# Patient Record
Sex: Female | Born: 1990 | Race: Black or African American | Hispanic: No | Marital: Single | Smoking: Never smoker
Health system: Southern US, Community
[De-identification: ages and names within clinical notes are randomized; demographics above are authoritative.]

## PROBLEM LIST (undated history)

## (undated) ENCOUNTER — Inpatient Hospital Stay (HOSPITAL_COMMUNITY): Payer: Self-pay

## (undated) DIAGNOSIS — G43909 Migraine, unspecified, not intractable, without status migrainosus: Secondary | ICD-10-CM

## (undated) HISTORY — PX: NO PAST SURGERIES: SHX2092

---
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
## 2012-02-04 ENCOUNTER — Emergency Department (INDEPENDENT_AMBULATORY_CARE_PROVIDER_SITE_OTHER)
Admission: EM | Admit: 2012-02-04 | Discharge: 2012-02-04 | Disposition: A | Discharge status: Home / Self Care | Source: Home / Self Care | Attending: Emergency Medicine | Admitting: Emergency Medicine

## 2012-02-04 ENCOUNTER — Encounter (HOSPITAL_COMMUNITY): Payer: Self-pay | Admitting: *Deleted

## 2012-02-04 DIAGNOSIS — B379 Candidiasis, unspecified: Secondary | ICD-10-CM

## 2012-02-04 DIAGNOSIS — N3000 Acute cystitis without hematuria: Secondary | ICD-10-CM

## 2012-02-04 DIAGNOSIS — B49 Unspecified mycosis: Secondary | ICD-10-CM

## 2012-02-04 DIAGNOSIS — N898 Other specified noninflammatory disorders of vagina: Secondary | ICD-10-CM

## 2012-02-04 LAB — POCT URINALYSIS DIP (DEVICE)
Protein, ur: 30 mg/dL — AB
Specific Gravity, Urine: 1.025 (ref 1.005–1.030)
Urobilinogen, UA: 1 mg/dL (ref 0.0–1.0)

## 2012-02-04 LAB — WET PREP, GENITAL: Trich, Wet Prep: NONE SEEN

## 2012-02-04 MED ORDER — LIDOCAINE HCL 2 % EX GEL: CUTANEOUS | Status: AC | PRN

## 2012-02-04 MED ORDER — ACYCLOVIR 400 MG PO TABS: 400.0000 mg | ORAL_TABLET | Freq: Three times a day (TID) | ORAL | Status: AC

## 2012-02-04 MED ORDER — NITROFURANTOIN MONOHYD MACRO 100 MG PO CAPS: 100.0000 mg | ORAL_CAPSULE | Freq: Two times a day (BID) | ORAL | Status: AC

## 2012-02-04 MED ORDER — FLUCONAZOLE 150 MG PO TABS: 150.0000 mg | ORAL_TABLET | Freq: Once | ORAL | Status: AC

## 2012-02-04 NOTE — ED Provider Notes (Signed)
Medical screening examination/treatment/procedure(s) were performed by non-physician practitioner and as supervising physician I was immediately available for consultation/collaboration.  Leslee Home, M.D.   Reuben Likes, MD 02/04/12 2239

## 2012-02-04 NOTE — ED Notes (Signed)
Pt  reports symptoms  Of  painfull  Frequent  Urination       With  Burning  Sensation in vaginal  Area   As  Well  As   A  Discharge         Pt  Is  5  Months  preg     -  Pt  Denied  Any  Bleeding         No  abd  Pain

## 2012-02-04 NOTE — ED Provider Notes (Signed)
History     CSN: [id number]  Arrival date & time 02/04/12  1127   None     Chief Complaint  Patient presents with  . Vaginal Discharge    (Consider location/radiation/quality/duration/timing/severity/associated sxs/prior treatment) The history is provided by the patient.  FAELYN SIGLER is a 21 y.o. female who complains of urinary frequency, urgency and dysuria x 2 days, without flank pain, fever, chills, or abnormal vaginal discharge or bleeding.   She does admit to vaginal "bumps" that are painful.  Has not taken medication for symptom relief.  Has no history of UTI, denies known kidney disease or structural abnormalities.  Last sexual intercourse one week ago, unprotected. Patient's last menstrual period was 08/27/2011.  Currently [redacted] weeks pregnant.    Past Medical History  Diagnosis Date  . No pertinent past medical history     Past Surgical History  Procedure Date  . No past surgeries     Family History  Problem Relation Age of Onset  . Anesthesia problems Neg Hx     History  Substance Use Topics  . Smoking status: Never Smoker   . Smokeless tobacco: Never Used  . Alcohol Use: No     socially drinks 1/2 drink/week    OB History    Grav Para Term Preterm Abortions TAB SAB Ect Mult Living   2    1     0      Review of Systems  Constitutional: Negative.   Respiratory: Negative.   Cardiovascular: Negative.   Gastrointestinal: Negative.   Genitourinary: Positive for dysuria, urgency, frequency, genital sores and vaginal pain. Negative for hematuria, flank pain, vaginal bleeding, vaginal discharge and pelvic pain.    Allergies  Review of patient's allergies indicates no known allergies.  Home Medications   Current Outpatient Rx  Name Route Sig Dispense Refill  . PRENATAL S PO Oral Take by mouth.    . ACYCLOVIR 400 MG PO TABS Oral Take 1 tablet (400 mg total) by mouth 3 (three) times daily. 60 tablet 5  . FLUCONAZOLE 150 MG PO TABS Oral Take 1 tablet (150  mg total) by mouth once. 1 tablet 0  . LIDOCAINE HCL 2 % EX GEL Topical Apply topically as needed. 30 mL 1  . NITROFURANTOIN MONOHYD MACRO 100 MG PO CAPS Oral Take 1 capsule (100 mg total) by mouth 2 (two) times daily. 14 capsule 0    BP 122/82  Pulse 90  Temp 98.3 F (36.8 C) (Oral)  Resp 16  SpO2 100%  LMP 08/27/2011  Physical Exam  Nursing note and vitals reviewed. Constitutional: She is oriented to person, place, and time. Vital signs are normal. She appears well-developed and well-nourished. She is active and cooperative.  HENT:  Head: Normocephalic.  Eyes: Conjunctivae are normal. Pupils are equal, round, and reactive to light. No scleral icterus.  Neck: Trachea normal. Neck supple.  Cardiovascular: Normal rate, regular rhythm, normal heart sounds, intact distal pulses and normal pulses.   Pulmonary/Chest: Effort normal and breath sounds normal.  Abdominal: There is no tenderness. There is no CVA tenderness.       Appears stated weeks in pregnancy.  Genitourinary: Uterus normal.    Pelvic exam was performed with patient supine. No labial fusion. There is tenderness and lesion on the right labia. There is tenderness and lesion on the left labia. Cervix exhibits no motion tenderness, no discharge and no friability. Right adnexum displays no tenderness. Left adnexum displays no tenderness. Vaginal discharge found.  Lymphadenopathy:       Right: No inguinal adenopathy present.       Left: No inguinal adenopathy present.  Neurological: She is alert and oriented to person, place, and time. No cranial nerve deficit or sensory deficit.  Skin: Skin is warm and dry.  Psychiatric: She has a normal mood and affect. Her speech is normal and behavior is normal. Judgment and thought content normal. Cognition and memory are normal.    ED Course  Procedures (including critical care time)  Labs Reviewed  POCT URINALYSIS DIP (DEVICE) - Abnormal; Notable for the following:    Ketones, ur  15 (*)     Hgb urine dipstick SMALL (*)     Protein, ur 30 (*)     Leukocytes, UA SMALL (*)  Biochemical Testing Only. Please order routine urinalysis from main lab if confirmatory testing is needed.   All other components within normal limits  WET PREP, GENITAL - Abnormal; Notable for the following:    Yeast Wet Prep HPF POC FEW (*)     WBC, Wet Prep HPF POC TOO NUMEROUS TO COUNT (*)     All other components within normal limits  HERPES SIMPLEX VIRUS CULTURE  URINE CULTURE  HSV 2 ANTIBODY, IGG  HSV 1 ANTIBODY, IGG   No results found.   1. Vaginal lesion   2. Acute cystitis   3. Yeast infection       MDM  Await herpes culture results.  Sitz baths, lidocaine gel for comfort.  Take medication as prescribed.  Follow up with your ob/gyn as scheduled on Friday 02/07/12, make them aware of current diagnosis and treatment.          Johnsie Kindred, NP 02/04/12 1354

## 2012-02-05 LAB — HSV 1 ANTIBODY, IGG: HSV 1 Glycoprotein G Ab, IgG: <0.1 IV

## 2012-02-06 ENCOUNTER — Telehealth (HOSPITAL_COMMUNITY): Payer: Self-pay | Admitting: *Deleted

## 2012-02-06 LAB — URINE CULTURE

## 2012-02-06 NOTE — ED Notes (Signed)
Pt. called on  VM 8/28 and said she was told to call back, if " her body rejected the medication."  She said she vomited this morning. I called pt. back and she said she is taking it with food.  She was able to keep it down last night and this morning. I told her to keep taking it with food and call back if she vomits it again.  She does not know the name of the antibiotic. Pt. told if she still has urine symptoms after she finished the Macrobid she would need to have the culture rechecked. Pt. verified x 2 and given results. 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 Pt. told she was adequately treated with Diflucan for the yeast and Acyclovir for the Herpes.  This would be her first outbreak.  Pt. instructed to notify her partner. You can pass the virus even when you don't have an outbreak, so always practice safe sex. Get treated for each outbreak with Acyclovir or Valtrex. You may want to get an OB-GYN or PCP who can call in a prescription for you when you have an outbreak or give you a years Rx. That she can refill with each outbreak.  Pt. voiced understanding. Vassie Moselle 02/06/2012

## 2012-07-15 ENCOUNTER — Encounter (HOSPITAL_COMMUNITY): Payer: Self-pay | Admitting: *Deleted

## 2012-07-15 ENCOUNTER — Emergency Department (INDEPENDENT_AMBULATORY_CARE_PROVIDER_SITE_OTHER)
Admission: EM | Admit: 2012-07-15 | Discharge: 2012-07-15 | Disposition: A | Discharge status: Home / Self Care | Payer: BC Managed Care – PPO | Source: Home / Self Care | Attending: Emergency Medicine | Admitting: Emergency Medicine

## 2012-07-15 DIAGNOSIS — G43909 Migraine, unspecified, not intractable, without status migrainosus: Secondary | ICD-10-CM

## 2012-07-15 HISTORY — DX: Migraine, unspecified, not intractable, without status migrainosus: G43.909

## 2012-07-15 MED ORDER — ONDANSETRON 4 MG PO TBDP
ORAL_TABLET | ORAL | Status: AC
  Filled 2012-07-15: qty 2

## 2012-07-15 MED ORDER — SUMATRIPTAN SUCCINATE 50 MG PO TABS: 50.0000 mg | ORAL_TABLET | ORAL | Status: DC | PRN

## 2012-07-15 MED ORDER — KETOROLAC TROMETHAMINE 60 MG/2ML IM SOLN
INTRAMUSCULAR | Status: AC
  Filled 2012-07-15: qty 2

## 2012-07-15 MED ORDER — DEXAMETHASONE SODIUM PHOSPHATE 10 MG/ML IJ SOLN
10.0000 mg | Freq: Once | INTRAMUSCULAR | Status: AC
  Administered 2012-07-15: 10 mg via INTRAMUSCULAR

## 2012-07-15 MED ORDER — DEXAMETHASONE SODIUM PHOSPHATE 10 MG/ML IJ SOLN
INTRAMUSCULAR | Status: AC
  Filled 2012-07-15: qty 1

## 2012-07-15 MED ORDER — KETOROLAC TROMETHAMINE 60 MG/2ML IM SOLN
60.0000 mg | Freq: Once | INTRAMUSCULAR | Status: AC
  Administered 2012-07-15: 60 mg via INTRAMUSCULAR

## 2012-07-15 NOTE — ED Provider Notes (Signed)
Chief Complaint  Patient presents with  . Migraine    History of Present Illness:   Tiffany Wang is in female has had a 7 year history of migraine headaches. A typical migraine began last night around 2 AM without any obvious precipitating factor. It began in the left side and now has spread to the right. It still throbbing and sharp and rated a 5-6/10 in intensity and associated with blurry vision. It's worse with head movement. She denies any photophobia or phonophobia. She has had some nausea but no vomiting. She's had some numbness of the left side of the face and both hands but no numbness of the legs, weakness, difficulty with speech, ambulation, coordination, dizziness, or balance. She denies any fever, chills, or stiff neck. She has seen at her local primary care physician for the migraines and was prescribed naproxen.  Review of Systems:  Other than noted above, the patient denies any of the following symptoms: Systemic:  No fever, chills, fatigue, photophobia, stiff neck. Eye:  No redness, eye pain, discharge, blurred vision, or diplopia. ENT:  No nasal congestion, rhinorrhea, sinus pressure or pain, sneezing, earache, or sore throat.  No jaw claudication. Neuro:  No paresthesias, loss of consciousness, seizure activity, muscle weakness, trouble with coordination or gait, trouble speaking or swallowing. Psych:  No depression, anxiety or trouble sleeping.  PMFSH:  Past medical history, family history, social history, meds, and allergies were reviewed.  Physical Exam:   Vital signs:  BP 116/79  Pulse 74  Temp 98.6 F (37 C) (Oral)  Resp 18  SpO2 100%  LMP 07/08/2011  Breastfeeding? Unknown General:  Alert and oriented.  In no distress. Eye:  Lids and conjunctivas normal.  PERRL,  Full EOMs.  Fundi benign with normal discs and vessels. ENT:  No cranial or facial tenderness to palpation.  TMs and canals clear.  Nasal mucosa was normal and uncongested without any drainage. No intra  oral lesions, pharynx clear, mucous membranes moist, dentition normal. Neck:  Supple, full ROM, no tenderness to palpation.  No adenopathy or mass. Neuro:  Alert and orented times 3.  Speech was clear, fluent, and appropriate.  Cranial nerves intact. No pronator drift, muscle strength normal. Finger to nose normal.  DTRs were 2+ and symmetrical.Station and gait were normal.  Romberg's sign was normal.  Able to perform tandem gait well. Psych:  Normal affect.  Medications given in [facility]:  Since he is driving, she was given Decadron 10 mg IM and Toradol 60 mg IM. She was told to take Benadryl 50 mg as soon she gets back home and to try to go to sleep.  Assessment:  The encounter diagnosis was Migraine headache.  Plan:   1.  The following meds were prescribed:   New Prescriptions   SUMATRIPTAN (IMITREX) 50 MG TABLET    Take 1 tablet (50 mg total) by mouth every 2 (two) hours as needed for migraine.   2.  The patient was instructed in symptomatic care and handouts were given. 3.  The patient was told to return if becoming worse in any way, if no better in 3 or 4 days, and given some red flag symptoms that would indicate earlier return.    Reuben Likes, MD 07/15/12 [phone]

## 2012-07-15 NOTE — ED Notes (Signed)
Pt  Reports   A  Migraine  Headache        yest         Has  History  Of this in  Past   She  Reports  Nausea  No  Vomiting  -  She  Is  Sitting  Upright on  Exam table  In no  Severe  Distress           She  Does  However report  Some  Tingling  Face

## 2013-01-15 ENCOUNTER — Ambulatory Visit (INDEPENDENT_AMBULATORY_CARE_PROVIDER_SITE_OTHER): Payer: BC Managed Care – PPO | Admitting: Family Medicine

## 2013-01-15 ENCOUNTER — Encounter: Payer: Self-pay | Admitting: Family Medicine

## 2013-01-15 VITALS — BP 120/84 | HR 72 | Temp 98.0°F | Resp 16 | Ht 60.25 in | Wt 165.4 lb

## 2013-01-15 DIAGNOSIS — G43909 Migraine, unspecified, not intractable, without status migrainosus: Secondary | ICD-10-CM

## 2013-01-15 DIAGNOSIS — R110 Nausea: Secondary | ICD-10-CM

## 2013-01-15 MED ORDER — RIZATRIPTAN BENZOATE 10 MG PO TABS: 10.0000 mg | ORAL_TABLET | ORAL | Status: AC | PRN

## 2013-01-15 NOTE — Patient Instructions (Addendum)
Migraine Headache A migraine headache is an intense, throbbing pain on one or both sides of your head. A migraine can last for 30 minutes to several hours. CAUSES  The exact cause of a migraine headache is not always known. However, a migraine may be caused when nerves in the brain become irritated and release chemicals that cause inflammation. This causes pain. SYMPTOMS  Pain on one or both sides of your head.  Pulsating or throbbing pain.  Severe pain that prevents daily activities.  Pain that is aggravated by any physical activity.  Nausea, vomiting, or both.  Dizziness.  Pain with exposure to bright lights, loud noises, or activity.  General sensitivity to bright lights, loud noises, or smells. Before you get a migraine, you may get warning signs that a migraine is coming (aura). An aura may include:  Seeing flashing lights.  Seeing bright spots, halos, or zig-zag lines.  Having tunnel vision or blurred vision.  Having feelings of numbness or tingling.  Having trouble talking.  Having muscle weakness. MIGRAINE TRIGGERS  Alcohol.  Smoking.  Stress.  Menstruation.  Aged cheeses.  Foods or drinks that contain nitrates, glutamate, aspartame, or tyramine.  Lack of sleep.  Chocolate.  Caffeine.  Hunger.  Physical exertion.  Fatigue.  Medicines used to treat chest pain (nitroglycerine), birth control pills, estrogen, and some blood pressure medicines. DIAGNOSIS  A migraine headache is often diagnosed based on:  Symptoms.  Physical examination.  A CT scan or MRI of your head. TREATMENT Medicines may be given for pain and nausea. Medicines can also be given to help prevent recurrent migraines.  HOME CARE INSTRUCTIONS  Only take over-the-counter or prescription medicines for pain or discomfort as directed by your caregiver. The use of long-term narcotics is not recommended.  Lie down in a dark, quiet room when you have a migraine.  Keep a journal  to find out what may trigger your migraine headaches. For example, write down:  What you eat and drink.  How much sleep you get.  Any change to your diet or medicines.  Limit alcohol consumption.  Quit smoking if you smoke.  Get 7 to 9 hours of sleep, or as recommended by your caregiver.  Limit stress.  Keep lights dim if bright lights bother you and make your migraines worse. SEEK IMMEDIATE MEDICAL CARE IF:   Your migraine becomes severe.  You have a fever.  You have a stiff neck.  You have vision loss.  You have muscular weakness or loss of muscle control.  You start losing your balance or have trouble walking.  You feel faint or pass out.  You have severe symptoms that are different from your first symptoms. MAKE SURE YOU:   Understand these instructions.  Will watch your condition.  Will get help right away if you are not doing well or get worse. Document Released: 05/27/2005 Document Revised: 08/19/2011 Document Reviewed: 05/17/2011 [employer] Patient Information 2014 [employer].  

## 2013-01-15 NOTE — Progress Notes (Signed)
[facility]:  Office Visit  Chief Complaint:  Chief Complaint  Patient presents with  . Migraine    had a migraine this morning needs refill on med pt's dr's in [place]    HPI: Tiffany Wang is a 22 y.o. female who complains of  Migraine med refills. She gets it once a month, usually around her  cycle, she slept off this current one. She has not had vomiting, some nasuea. Feels like regular migraine. Not the worse HA of her life. Has light and noise sensitivity. She has an 54 month old so needs t be functional. Tried tylenol without relief, not breastfeeding.  Past Medical History  Diagnosis Date  . Migraine    Past Surgical History  Procedure Laterality Date  . No past surgeries     History   Social History  . Marital Status: Single    Spouse Name: N/A    Number of Children: N/A  . Years of Education: N/A   Social History Main Topics  . Smoking status: Never Smoker   . Smokeless tobacco: Never Used  . Alcohol Use: No     Comment: socially drinks 1/2 drink/week  . Drug Use: No  . Sexually Active: Yes    Birth Control/ Protection: None   Other Topics Concern  . None   Social History Narrative  . None   Family History  Problem Relation Age of Onset  . Anesthesia problems Neg Hx    No Known Allergies Prior to Admission medications   Medication Sig Start Date End Date Taking? Authorizing Provider  estradiol (VIVELLE-DOT) 0.025 MG/24HR Place 1 patch onto the skin 2 (two) times a week.   Yes Historical Provider, MD  lidocaine (XYLOCAINE JELLY) 2 % jelly Apply topically as needed. 02/04/12 02/03/13  Johnsie Kindred, NP  Naproxen (NAPROSYN PO) Take by mouth.    Historical Provider, MD  Prenatal Vit-Fe Fumarate-FA (PRENATAL S PO) Take by mouth.    Historical Provider, MD  SUMAtriptan (IMITREX) 50 MG tablet Take 1 tablet (50 mg total) by mouth every 2 (two) hours as needed for migraine. 07/15/12   Reuben Likes, MD     ROS: The patient denies fevers,  chills, night sweats, unintentional weight loss, chest pain, palpitations, wheezing, dyspnea on exertion, abdominal pain, dysuria, hematuria, melena, numbness, weakness, or tingling.   All other systems have been reviewed and were otherwise negative with the exception of those mentioned in the HPI and as above.    PHYSICAL EXAM: Filed Vitals:   01/15/13 1238  BP: 120/84  Pulse: 72  Temp: 98 F (36.7 C)  Resp: 16   Filed Vitals:   01/15/13 1238  Height: 5' 0.25" (1.53 m)  Weight: 165 lb 6.4 oz (75.025 kg)   Body mass index is 32.05 kg/(m^2).  General: Alert, no acute distress HEENT:  Normocephalic, atraumatic, oropharynx patent. EOMI, PERRLA, fundoscopic exam is normal Cardiovascular:  Regular rate and rhythm, no rubs murmurs or gallops.  No Carotid bruits, radial pulse intact. No pedal edema.  Respiratory: Clear to auscultation bilaterally.  No wheezes, rales, or rhonchi.  No cyanosis, no use of accessory musculature GI: No organomegaly, abdomen is soft and non-tender, positive bowel sounds.  No masses. Skin: No rashes. Neurologic: Facial musculature symmetric. Psychiatric: Patient is appropriate throughout our interaction. Lymphatic: No cervical lymphadenopathy Musculoskeletal: Gait intact.   LABS:    EKG/XRAY:   Primary read interpreted by Dr. Conley Rolls at [facility].   ASSESSMENT/PLAN: Encounter Diagnoses  Name Primary?  . Migraine Yes  . Nausea alone    Refilled [employer] [employer] note given Declined nausea medicine F/u prn Gross sideeffects, risk and benefits, and alternatives of medications d/w patient. Patient is aware that all medications have potential sideeffects and we are unable to predict every sideeffect or drug-drug interaction that may occur.     Sharada Albornoz PHUONG, DO 01/15/2013 1:19 PM

## 2013-04-07 ENCOUNTER — Emergency Department (INDEPENDENT_AMBULATORY_CARE_PROVIDER_SITE_OTHER)
Admission: EM | Admit: 2013-04-07 | Discharge: 2013-04-07 | Disposition: A | Discharge status: Home / Self Care | Payer: Federal, State, Local not specified - PPO | Source: Home / Self Care | Attending: Emergency Medicine | Admitting: Emergency Medicine

## 2013-04-07 ENCOUNTER — Encounter (HOSPITAL_COMMUNITY): Payer: Self-pay | Admitting: Emergency Medicine

## 2013-04-07 DIAGNOSIS — H109 Unspecified conjunctivitis: Secondary | ICD-10-CM

## 2013-04-07 MED ORDER — TOBRAMYCIN-DEXAMETHASONE 0.3-0.1 % OP SUSP: 1.0000 [drp] | OPHTHALMIC | Status: DC

## 2013-04-07 NOTE — ED Provider Notes (Signed)
CSN: [id number]     Arrival date & time 04/07/13  6295 History   First MD Initiated Contact with Patient 04/07/13 [phone]     Chief Complaint  Patient presents with  . Conjunctivitis   (Consider location/radiation/quality/duration/timing/severity/associated sxs/prior Treatment) HPI Comments: 22 year old female complaining of left eye discomfort for 48 hours. In the past 24 hours she has experienced redness, discomfort, conjunctival swelling and photophobia. She had been wearing contacts at the time but has since removed them. Denies visual changes other than that associated with her usual retraction error.  Patient is a 22 y.o. female presenting with conjunctivitis.  Conjunctivitis Pertinent negatives include no headaches.    Past Medical History  Diagnosis Date  . Migraine    Past Surgical History  Procedure Laterality Date  . No past surgeries     Family History  Problem Relation Age of Onset  . Anesthesia problems Neg Hx    History  Substance Use Topics  . Smoking status: Never Smoker   . Smokeless tobacco: Never Used  . Alcohol Use: No     Comment: socially drinks 1/2 drink/week   OB History   Grav Para Term Preterm Abortions TAB SAB Ect Mult Living   2    1     0     Review of Systems  Constitutional: Negative.   Eyes: Positive for photophobia, pain, redness and itching. Negative for discharge and visual disturbance.  Neurological: Negative for dizziness, tremors, syncope, facial asymmetry, speech difficulty, weakness and headaches.  All other systems reviewed and are negative.    Allergies  Review of patient's allergies indicates no known allergies.  Home Medications   Current Outpatient Rx  Name  Route  Sig  Dispense  Refill  . estradiol (VIVELLE-DOT) 0.025 MG/24HR   Transdermal   Place 1 patch onto the skin 2 (two) times a week.         . Naproxen (NAPROSYN PO)   Oral   Take by mouth.         . Prenatal Vit-Fe Fumarate-FA (PRENATAL S PO)    Oral   Take by mouth.         . rizatriptan (MAXALT) 10 MG tablet   Oral   Take 1 tablet (10 mg total) by mouth as needed for migraine. May repeat in 2 hours if needed   10 tablet   6   . tobramycin-dexamethasone (TOBRADEX) ophthalmic solution   Left Eye   Place 1 drop into the left eye every 4 (four) hours while awake.   5 mL   0    BP 119/82  Pulse 100  Temp(Src) 98.4 F (36.9 C) (Oral)  Resp 20  SpO2 96%  LMP 03/06/2013 Physical Exam  Nursing note and vitals reviewed. Constitutional: She is oriented to person, place, and time. She appears well-developed and well-nourished. No distress.  Eyes: EOM are normal. Pupils are equal, round, and reactive to light.  Right eye exam is normal. Left eye with upper and lower conjunctivitis in the lower conjunctival swelling. The sclera is mildly injected. Chest photophobia during the exam. Anterior chamber appears clear. No foreign body seen.  Neck: Normal range of motion. Neck supple.  Pulmonary/Chest: Effort normal. No respiratory distress.  Neurological: She is alert and oriented to person, place, and time. She exhibits normal muscle tone.  Skin: Skin is warm and dry. She is not diaphoretic.  Psychiatric: She has a normal mood and affect.    ED Course  Procedures (including critical  care time) Labs Review Labs Reviewed - No data to display Imaging Review No results found.    MDM   1. Conjunctivitis    Dont wear contacts until completely back to nl, asymptomatic Follow with the eye DR. Tomorrow if worse or not getting better. Tobradex op as directed    Hayden Rasmussen, NP 04/07/13 1013

## 2013-04-07 NOTE — ED Notes (Signed)
Pt c/o pink eye to the left eye onset Monday night Sxs include: redness, burning, HA, sensitive to bright light, runny nose Denies: f/v/n/d Alert w/no signs of acute distress.

## 2013-04-07 NOTE — ED Provider Notes (Signed)
Medical screening examination/treatment/procedure(s) were performed by non-physician practitioner and as supervising physician I was immediately available for consultation/collaboration.  Jenyfer Trawick, M.D.  Dezmon Conover C Shawndra Clute, MD 04/07/13 1716 

## 2013-10-13 ENCOUNTER — Telehealth: Payer: Self-pay

## 2013-10-13 NOTE — Telephone Encounter (Signed)
Patient provided information below:  NEW PATIENT- encouraged to have previous records faxed here.     Medication and allergies:  Reviewed and updated  90 day supply/mail order: n/a [facility] pharmacy:  [facility] [id number] - [place], [place] - [address] [facility] [address]   Immunizations due:  See below   A/P: No changes to personal, family history or past surgical hx PAP- was unsure of last PAP, believes her last one was in Jan 2013.  Unsure of the results. Flu- did not receive Tdap- unsure of last vaccine; thinks it's been less than 10 years ago  To Discuss with Provider: Patient would like to change BC method from the patch to possibly the pill.

## 2013-10-14 ENCOUNTER — Ambulatory Visit (INDEPENDENT_AMBULATORY_CARE_PROVIDER_SITE_OTHER): Payer: Federal, State, Local not specified - PPO | Admitting: Family Medicine

## 2013-10-14 ENCOUNTER — Encounter: Payer: Self-pay | Admitting: Family Medicine

## 2013-10-14 VITALS — BP 130/80 | HR 80 | Temp 98.2°F | Resp 16 | Ht 61.0 in | Wt 172.5 lb

## 2013-10-14 DIAGNOSIS — IMO0001 Reserved for inherently not codable concepts without codable children: Secondary | ICD-10-CM

## 2013-10-14 DIAGNOSIS — Z309 Encounter for contraceptive management, unspecified: Secondary | ICD-10-CM

## 2013-10-14 DIAGNOSIS — Z0000 Encounter for general adult medical examination without abnormal findings: Secondary | ICD-10-CM | POA: Insufficient documentation

## 2013-10-14 LAB — CBC WITH DIFFERENTIAL/PLATELET
Basophils Absolute: 0 10*3/uL (ref 0.0–0.1)
Basophils Relative: 0.4 % (ref 0.0–3.0)
EOS PCT: 2.1 % (ref 0.0–5.0)
Eosinophils Absolute: 0.1 10*3/uL (ref 0.0–0.7)
HCT: 37.8 % (ref 36.0–46.0)
Hemoglobin: 12.4 g/dL (ref 12.0–15.0)
Lymphocytes Relative: 43.3 % (ref 12.0–46.0)
Lymphs Abs: 1.9 10*3/uL (ref 0.7–4.0)
MCHC: 32.7 g/dL (ref 30.0–36.0)
MCV: 83.6 fl (ref 78.0–100.0)
MONOS PCT: 6.9 % (ref 3.0–12.0)
Monocytes Absolute: 0.3 10*3/uL (ref 0.1–1.0)
NEUTROS PCT: 47.3 % (ref 43.0–77.0)
Neutro Abs: 2.1 10*3/uL (ref 1.4–7.7)
PLATELETS: 264 10*3/uL (ref 150.0–400.0)
RBC: 4.53 Mil/uL (ref 3.87–5.11)
RDW: 15.1 % (ref 11.5–15.5)
WBC: 4.4 10*3/uL (ref 4.0–10.5)

## 2013-10-14 LAB — BASIC METABOLIC PANEL
BUN: 13 mg/dL (ref 6–23)
CALCIUM: 9.3 mg/dL (ref 8.4–10.5)
CHLORIDE: 104 meq/L (ref 96–112)
CO2: 26 meq/L (ref 19–32)
CREATININE: 0.7 mg/dL (ref 0.4–1.2)
GFR: 133.38 mL/min (ref >60.00)
Glucose, Bld: 77 mg/dL (ref 70–99)
Potassium: 3.5 mEq/L (ref 3.5–5.1)
Sodium: 136 mEq/L (ref 135–145)

## 2013-10-14 LAB — LIPID PANEL
CHOLESTEROL: 132 mg/dL (ref 0–200)
HDL: 60.6 mg/dL (ref >39.00)
LDL CALC: 61 mg/dL (ref 0–99)
TRIGLYCERIDES: 50 mg/dL (ref 0.0–149.0)
Total CHOL/HDL Ratio: 2
VLDL: 10 mg/dL (ref 0.0–40.0)

## 2013-10-14 LAB — HEPATIC FUNCTION PANEL
ALBUMIN: 4.1 g/dL (ref 3.5–5.2)
ALT: 13 U/L (ref 0–35)
AST: 17 U/L (ref 0–37)
Alkaline Phosphatase: 48 U/L (ref 39–117)
Bilirubin, Direct: 0 mg/dL (ref 0.0–0.3)
Total Bilirubin: 0.9 mg/dL (ref 0.2–1.2)
Total Protein: 7.5 g/dL (ref 6.0–8.3)

## 2013-10-14 LAB — TSH: TSH: 0.63 u[IU]/mL (ref 0.35–4.50)

## 2013-10-14 NOTE — Progress Notes (Signed)
   Subjective:    Patient ID: Tiffany Wang, female    DOB: 02/03/1991, 23 y.o.   MRN: [id number]  HPI New to establish.  Previous MD- [facility]  GYN- none locally, was previously seen in [place].   Review of Systems Patient reports no vision/ hearing changes, adenopathy,fever, weight change,  persistant/recurrent hoarseness , swallowing issues, chest pain, palpitations, edema, persistant/recurrent cough, hemoptysis, dyspnea (rest/exertional/paroxysmal nocturnal), gastrointestinal bleeding (melena, rectal bleeding), abdominal pain, significant heartburn, bowel changes, GU symptoms (dysuria, hematuria, incontinence), Gyn symptoms (abnormal  bleeding, pain),  syncope, focal weakness, memory loss, numbness & tingling, skin/hair/nail changes, abnormal bruising or bleeding, anxiety, or depression.     Objective:   Physical Exam General Appearance:    Alert, cooperative, no distress, appears stated age  Head:    Normocephalic, without obvious abnormality, atraumatic  Eyes:    PERRL, conjunctiva/corneas clear, EOM's intact, fundi    benign, both eyes  Ears:    Normal TM's and external ear canals, both ears  Nose:   Nares normal, septum midline, mucosa normal, no drainage    or sinus tenderness  Throat:   Lips, mucosa, and tongue normal; teeth and gums normal  Neck:   Supple, symmetrical, trachea midline, no adenopathy;    Thyroid: no enlargement/tenderness/nodules  Back:     Symmetric, no curvature, ROM normal, no CVA tenderness  Lungs:     Clear to auscultation bilaterally, respirations unlabored  Chest Wall:    No tenderness or deformity   Heart:    Regular rate and rhythm, S1 and S2 normal, no murmur, rub   or gallop  Breast Exam:    Deferred to GYN  Abdomen:     Soft, non-tender, bowel sounds active all four quadrants,    no masses, no organomegaly  Genitalia:    Deferred to GYN  Rectal:    Extremities:   Extremities normal, atraumatic, no cyanosis or edema  Pulses:   2+ and  symmetric all extremities  Skin:   Skin color, texture, turgor normal, no rashes or lesions  Lymph nodes:   Cervical, supraclavicular, and axillary nodes normal  Neurologic:   CNII-XII intact, normal strength, sensation and reflexes    throughout          Assessment & Plan:

## 2013-10-14 NOTE — Assessment & Plan Note (Signed)
Pt's PE WNL w/ exception of weight.  Check labs.  Refer to GYN for possible Implanon insertion.  Anticipatory guidance provided.

## 2013-10-14 NOTE — Patient Instructions (Signed)
Follow up in 1 year or as needed We'll notify you of your lab results and make any changes if needed Try and make healthy food choices and get regular exercise Keep up the good work!  You look great! Welcome!  We're glad to have you!!!

## 2013-10-14 NOTE — Progress Notes (Signed)
Pre visit review using our clinic review tool, if applicable. No additional management support is needed unless otherwise documented below in the visit note. 

## 2013-10-15 ENCOUNTER — Encounter: Payer: Self-pay | Admitting: General Practice

## 2013-10-19 LAB — VITAMIN D 1,25 DIHYDROXY
VITAMIN D 1, 25 (OH) TOTAL: 53 pg/mL (ref 18–72)
VITAMIN D3 1, 25 (OH): 53 pg/mL

## 2013-10-20 ENCOUNTER — Encounter: Payer: Self-pay | Admitting: General Practice

## 2014-04-11 ENCOUNTER — Encounter: Payer: Self-pay | Admitting: Family Medicine

## 2016-02-28 ENCOUNTER — Encounter (HOSPITAL_COMMUNITY): Payer: Self-pay | Admitting: Emergency Medicine

## 2016-02-28 ENCOUNTER — Emergency Department (HOSPITAL_COMMUNITY)
Admission: EM | Admit: 2016-02-28 | Discharge: 2016-02-28 | Disposition: A | Discharge status: Home / Self Care | Payer: No Typology Code available for payment source | Attending: Emergency Medicine | Admitting: Emergency Medicine

## 2016-02-28 DIAGNOSIS — S0990XA Unspecified injury of head, initial encounter: Secondary | ICD-10-CM | POA: Diagnosis not present

## 2016-02-28 DIAGNOSIS — Y92410 Unspecified street and highway as the place of occurrence of the external cause: Secondary | ICD-10-CM | POA: Insufficient documentation

## 2016-02-28 DIAGNOSIS — Y999 Unspecified external cause status: Secondary | ICD-10-CM | POA: Insufficient documentation

## 2016-02-28 DIAGNOSIS — Y939 Activity, unspecified: Secondary | ICD-10-CM | POA: Insufficient documentation

## 2016-02-28 DIAGNOSIS — R519 Headache, unspecified: Secondary | ICD-10-CM

## 2016-02-28 DIAGNOSIS — R51 Headache: Secondary | ICD-10-CM

## 2016-02-28 MED ORDER — METOCLOPRAMIDE HCL 10 MG PO TABS
10.0000 mg | ORAL_TABLET | Freq: Once | ORAL | Status: AC
  Administered 2016-02-28: 10 mg via ORAL
  Filled 2016-02-28: qty 1

## 2016-02-28 MED ORDER — KETOROLAC TROMETHAMINE 30 MG/ML IJ SOLN
30.0000 mg | Freq: Once | INTRAMUSCULAR | Status: AC
  Administered 2016-02-28: 30 mg via INTRAMUSCULAR
  Filled 2016-02-28: qty 1

## 2016-02-28 MED ORDER — NAPROXEN 500 MG PO TABS: 500.0000 mg | ORAL_TABLET | Freq: Two times a day (BID) | ORAL | 0 refills | Status: AC | PRN

## 2016-02-28 NOTE — ED Notes (Signed)
Complains of upper abdominal tightening
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
